# Patient Record
Sex: Male | Born: 1985 | Hispanic: No | Marital: Married | Smoking: Never smoker
Health system: Southern US, Community
[De-identification: ages and names within clinical notes are randomized; demographics above are authoritative.]

## PROBLEM LIST (undated history)

## (undated) DIAGNOSIS — J9383 Other pneumothorax: Secondary | ICD-10-CM

---

## 2016-05-17 ENCOUNTER — Emergency Department (HOSPITAL_COMMUNITY): Payer: BLUE CROSS/BLUE SHIELD

## 2016-05-17 ENCOUNTER — Inpatient Hospital Stay (HOSPITAL_COMMUNITY)
Admission: EM | Admit: 2016-05-17 | Discharge: 2016-05-21 | DRG: 200 | Disposition: A | Discharge status: Home / Self Care | Payer: BLUE CROSS/BLUE SHIELD | Attending: Cardiothoracic Surgery | Admitting: Cardiothoracic Surgery

## 2016-05-17 ENCOUNTER — Encounter (HOSPITAL_COMMUNITY): Payer: Self-pay | Admitting: *Deleted

## 2016-05-17 DIAGNOSIS — J9311 Primary spontaneous pneumothorax: Secondary | ICD-10-CM | POA: Diagnosis not present

## 2016-05-17 DIAGNOSIS — J90 Pleural effusion, not elsewhere classified: Secondary | ICD-10-CM | POA: Diagnosis present

## 2016-05-17 DIAGNOSIS — R609 Edema, unspecified: Secondary | ICD-10-CM | POA: Diagnosis present

## 2016-05-17 DIAGNOSIS — Z9689 Presence of other specified functional implants: Secondary | ICD-10-CM

## 2016-05-17 DIAGNOSIS — J9383 Other pneumothorax: Secondary | ICD-10-CM

## 2016-05-17 DIAGNOSIS — J939 Pneumothorax, unspecified: Secondary | ICD-10-CM

## 2016-05-17 HISTORY — DX: Other pneumothorax: J93.83

## 2016-05-17 HISTORY — PX: CHEST TUBE INSERTION: SHX231

## 2016-05-17 LAB — CBC
HCT: 47.7 % (ref 39.0–52.0)
HCT: 48.2 % (ref 39.0–52.0)
HEMOGLOBIN: 16.9 g/dL (ref 13.0–17.0)
Hemoglobin: 16.9 g/dL (ref 13.0–17.0)
MCH: 28.8 pg (ref 26.0–34.0)
MCH: 28.5 pg (ref 26.0–34.0)
MCHC: 35.4 g/dL (ref 30.0–36.0)
MCHC: 35.1 g/dL (ref 30.0–36.0)
MCV: 81.3 fL (ref 78.0–100.0)
MCV: 81.4 fL (ref 78.0–100.0)
PLATELETS: 202 10*3/uL (ref 150–400)
Platelets: 201 10*3/uL (ref 150–400)
RBC: 5.87 MIL/uL — AB (ref 4.22–5.81)
RBC: 5.92 MIL/uL — ABNORMAL HIGH (ref 4.22–5.81)
RDW: 12.6 % (ref 11.5–15.5)
RDW: 12.7 % (ref 11.5–15.5)
WBC: 6.9 10*3/uL (ref 4.0–10.5)
WBC: 9.4 10*3/uL (ref 4.0–10.5)

## 2016-05-17 LAB — BASIC METABOLIC PANEL
ANION GAP: 6 (ref 5–15)
BUN: 9 mg/dL (ref 6–20)
CO2: 25 mmol/L (ref 22–32)
Calcium: 8.8 mg/dL — ABNORMAL LOW (ref 8.9–10.3)
Chloride: 107 mmol/L (ref 101–111)
Creatinine, Ser: 0.94 mg/dL (ref 0.61–1.24)
Glucose, Bld: 101 mg/dL — ABNORMAL HIGH (ref 65–99)
POTASSIUM: 4.2 mmol/L (ref 3.5–5.1)
SODIUM: 138 mmol/L (ref 135–145)

## 2016-05-17 LAB — CREATININE, SERUM
Creatinine, Ser: 1.02 mg/dL (ref 0.61–1.24)
GFR calc Af Amer: >60 mL/min (ref >60)
GFR calc non Af Amer: >60 mL/min (ref >60)

## 2016-05-17 MED ORDER — SODIUM CHLORIDE 0.9% FLUSH
3.0000 mL | Freq: Two times a day (BID) | INTRAVENOUS | Status: DC
  Administered 2016-05-17 – 2016-05-21 (×7): 3 mL via INTRAVENOUS

## 2016-05-17 MED ORDER — ONDANSETRON HCL 4 MG PO TABS: 4.0000 mg | ORAL_TABLET | Freq: Four times a day (QID) | ORAL | Status: DC | PRN

## 2016-05-17 MED ORDER — KETOROLAC TROMETHAMINE 15 MG/ML IJ SOLN
15.0000 mg | Freq: Four times a day (QID) | INTRAMUSCULAR | Status: DC | PRN
  Administered 2016-05-19 – 2016-05-20 (×3): 15 mg via INTRAVENOUS
  Filled 2016-05-17 (×3): qty 1

## 2016-05-17 MED ORDER — ASPIRIN EC 81 MG PO TBEC
81.0000 mg | DELAYED_RELEASE_TABLET | Freq: Every day | ORAL | Status: DC
  Administered 2016-05-17 – 2016-05-21 (×5): 81 mg via ORAL
  Filled 2016-05-17 (×5): qty 1

## 2016-05-17 MED ORDER — LIDOCAINE HCL (PF) 1 % IJ SOLN
INTRAMUSCULAR | Status: AC
  Filled 2016-05-17: qty 30

## 2016-05-17 MED ORDER — ONDANSETRON HCL 4 MG/2ML IJ SOLN
4.0000 mg | Freq: Four times a day (QID) | INTRAMUSCULAR | Status: DC | PRN
  Administered 2016-05-18: 4 mg via INTRAVENOUS
  Filled 2016-05-17: qty 2

## 2016-05-17 MED ORDER — DM-GUAIFENESIN ER 30-600 MG PO TB12
1.0000 | ORAL_TABLET | Freq: Two times a day (BID) | ORAL | Status: DC
  Administered 2016-05-17 – 2016-05-21 (×8): 1 via ORAL
  Filled 2016-05-17 (×8): qty 1

## 2016-05-17 MED ORDER — ACETAMINOPHEN 650 MG RE SUPP: 650.0000 mg | Freq: Four times a day (QID) | RECTAL | Status: DC | PRN

## 2016-05-17 MED ORDER — ACETAMINOPHEN 325 MG PO TABS: 650.0000 mg | ORAL_TABLET | Freq: Four times a day (QID) | ORAL | Status: DC | PRN

## 2016-05-17 MED ORDER — OXYCODONE HCL 5 MG PO TABS
5.0000 mg | ORAL_TABLET | ORAL | Status: DC | PRN
  Administered 2016-05-17 – 2016-05-21 (×10): 5 mg via ORAL
  Filled 2016-05-17 (×10): qty 1

## 2016-05-17 MED ORDER — FENTANYL CITRATE (PF) 100 MCG/2ML IJ SOLN
50.0000 ug | INTRAMUSCULAR | Status: DC | PRN
  Administered 2016-05-17 – 2016-05-19 (×4): 50 ug via INTRAVENOUS
  Filled 2016-05-17 (×4): qty 2

## 2016-05-17 MED ORDER — SORBITOL 70 % SOLN
30.0000 mL | Freq: Every day | Status: DC | PRN
  Filled 2016-05-17: qty 30

## 2016-05-17 MED ORDER — MIDAZOLAM HCL 2 MG/2ML IJ SOLN
2.0000 mg | Freq: Once | INTRAMUSCULAR | Status: AC
  Administered 2016-05-17: 1 mg via INTRAVENOUS
  Filled 2016-05-17: qty 2

## 2016-05-17 MED ORDER — FENTANYL CITRATE (PF) 100 MCG/2ML IJ SOLN
50.0000 ug | INTRAMUSCULAR | Status: DC | PRN
  Administered 2016-05-17: 50 ug via INTRAVENOUS
  Filled 2016-05-17: qty 2

## 2016-05-17 MED ORDER — LIDOCAINE HCL (PF) 1 % IJ SOLN
30.0000 mL | Freq: Once | INTRAMUSCULAR | Status: AC
  Administered 2016-05-17: 30 mL

## 2016-05-17 MED ORDER — ENOXAPARIN SODIUM 40 MG/0.4ML ~~LOC~~ SOLN
40.0000 mg | SUBCUTANEOUS | Status: DC
  Administered 2016-05-18 – 2016-05-21 (×3): 40 mg via SUBCUTANEOUS
  Filled 2016-05-17 (×3): qty 0.4

## 2016-05-17 MED ORDER — LIDOCAINE HCL (CARDIAC) 20 MG/ML IV SOLN
INTRAVENOUS | Status: AC
  Filled 2016-05-17: qty 5

## 2016-05-17 MED ORDER — DEXTROSE-NACL 5-0.45 % IV SOLN
INTRAVENOUS | Status: DC
  Administered 2016-05-17 (×2): via INTRAVENOUS

## 2016-05-17 MED ORDER — SENNA 8.6 MG PO TABS
1.0000 | ORAL_TABLET | Freq: Two times a day (BID) | ORAL | Status: DC
  Administered 2016-05-17 – 2016-05-21 (×8): 8.6 mg via ORAL
  Filled 2016-05-17 (×8): qty 1

## 2016-05-17 NOTE — Progress Notes (Signed)
CT Surgery Op note  20 F L chest tube placed in L chest in ED for 40% L   spontaneous pneumothorax Patient to be admitted to 2 W for chest tube  management CXR pending  P Donata Clay MD

## 2016-05-17 NOTE — ED Notes (Signed)
Patient transported to X-ray 

## 2016-05-17 NOTE — ED Notes (Signed)
Attempted report x 2 

## 2016-05-17 NOTE — ED Triage Notes (Signed)
Pt saw yesterday for cough for 3 weeks and feels pressure with deep breath. Pt does have intermittent difficulty breathing.  No distress.  Pt was told he had a collapsed lung and sent here.

## 2016-05-17 NOTE — H&P (Signed)
[address]       [place] [address]             [phone]        Brylin Stanislawski [facility] Medical Record #[id number] Date of Birth: May 02, 1985  Referring: Dr. Lynelle Doctor  Primary Care: No PCP Per Patient  Chief Complaint:    Chief Complaint  Patient presents with  . Other    cough/collapsed lung  . Shortness of Breath  Patient examined, CT scan of chest reviewed showing significant 30-40 percent spontaneous left pneumothorax and chest tube placed in the emergency department  History of Present Illness:     Very nice 31 year old male nonsmoker presented to the ED with several days complaints of left chest discomfort shortness of breath and significant coughing preceding symptoms. He denies any trauma. He has no previous history of spontaneous pneumothorax. He denies any chronic lung disease asthma or previously abnormal chest x-ray. Chest x-ray in the emergency department showed a significant left spontaneous pneumothorax. He had normal saturation 96% on room air. Thoracic surgical evaluation was requested. I reviewed the x-ray and examined patient and recommended left chest tube placement.  Under local anesthesia and IV conscious sedation monitored with end-tidal CO2 he underwent placement (20 French chest tube with reexpansion of lung. There is no air leak from the chest tube following placement. The patient readmitted to the hospital for chest tube management.   Current Activity/ Functional Status: Patient works full time as a Arts development officer at [employer] The patient is married.   Zubrod Score: At the time of surgery this patient's most appropriate activity status/level should be described as:     0    Normal activity, no symptoms     1    Restricted in physical strenuous activity but ambulatory, able to do out light work     2    Ambulatory and capable of self care, unable to do work activities, up and about                  more than 50%  Of the time                                3    Only limited self care, in bed greater than 50% of waking hours     4    Completely disabled, no self care, confined to bed or chair     5    Moribund  History reviewed. No pertinent past medical history.  History reviewed. No pertinent surgical history.  History  Smoking Status  . Never Smoker  Smokeless Tobacco  . Never Used   History  Alcohol Use No    Social History   Social History  . Marital status: Married    Spouse name: N/A  . Number of children: N/A  . Years of education: N/A   Occupational History  . Not on file.   Social History Main Topics  . Smoking status: Never Smoker  . Smokeless tobacco: Never Used  . Alcohol use No  . Drug use: No  . Sexual activity: Not on file   Other Topics Concern  . Not on file   Social History Narrative  . No narrative on file    No Known Allergies  Current Facility-Administered Medications  Medication Dose Route Frequency Provider Last Rate Last Dose  .  acetaminophen (TYLENOL) tablet 650 mg  650 mg Oral Q6H PRN Kerin Perna, MD       Or  . acetaminophen (TYLENOL) suppository 650 mg  650 mg Rectal Q6H PRN Kerin Perna, MD      . aspirin EC tablet 81 mg  81 mg Oral Daily Kerin Perna, MD   81 mg at 05/17/16 1806  . dextrose 5 %-0.45 % sodium chloride infusion   Intravenous Continuous Kerin Perna, MD 30 mL/hr at 05/17/16 1806    . [START ON 05/18/2016] enoxaparin (LOVENOX) injection 40 mg  40 mg Subcutaneous Q24H Kerin Perna, MD      . fentaNYL (SUBLIMAZE) injection 50 mcg  50 mcg Intravenous Q4H PRN Kerin Perna, MD      . ketorolac (TORADOL) 15 MG/ML injection 15 mg  15 mg Intravenous Q6H PRN Kerin Perna, MD      . lidocaine (PF) (XYLOCAINE) 1 % injection           . ondansetron (ZOFRAN) tablet 4 mg  4 mg Oral Q6H PRN Kerin Perna, MD       Or  . ondansetron [facility]) injection 4 mg  4 mg Intravenous Q6H PRN Kerin Perna,  MD      . oxyCODONE (Oxy IR/ROXICODONE) immediate release tablet 5 mg  5 mg Oral Q4H PRN Kerin Perna, MD   5 mg at 05/17/16 1806  . senna (SENOKOT) tablet 8.6 mg  1 tablet Oral BID Kerin Perna, MD      . sodium chloride flush (NS) 0.9 % injection 3 mL  3 mL Intravenous Q12H Kerin Perna, MD      . sorbitol 70 % solution 30 mL  30 mL Oral Daily PRN Kerin Perna, MD       No current outpatient prescriptions on file.     (Not in a hospital admission)  History reviewed. No pertinent family history.   Review of Systems:       Cardiac Review of Systems: Y or N  Chest Pain [ y   ]  Resting SOB [ n  ] Exertional SOB  Cove.Etienne  ]  Orthopnea Cove.Etienne  ]   Pedal Edema [ n  ]    Palpitations Milo.Brash  ] Syncope  [ n ]   Presyncope [  n ]  General Review of Systems: [Y] = yes [  ]=no Constitional: recent weight change [  ]; anorexia [  ]; fatigue [  ]; nausea [  ]; night sweats [  ]; fever [  ]; or chills [  ]                                                               Dental: poor dentition[  ]; Last Dentist visit:   Eye : blurred vision [  ]; diplopia [   ]; vision changes [  ];  Amaurosis fugax[  ]; Resp: cough [  ];  wheezing[  ];  hemoptysis[  ]; shortness of breath[  ]; paroxysmal nocturnal dyspnea[  ]; dyspnea on exertion[y  ]; or orthopnea[  ];  GI:  gallstones[  ], vomiting[  ];  dysphagia[  ]; melena[  ];  hematochezia [  ]; heartburn[  ];  Hx of  Colonoscopy[  ]; GU: kidney stones [  ]; hematuria[  ];   dysuria [  ];  nocturia[  ];  history of     obstruction [  ]; urinary frequency [  ]             Skin: rash, swelling[  ];, hair loss[  ];  peripheral edema[  ];  or itching[  ]; Musculosketetal: myalgias[  ];  joint swelling[  ];  joint erythema[  ];  joint pain[  ];  back pain[  ];  Heme/Lymph: bruising[  ];  bleeding[  ];  anemia[  ];  Neuro: TIA[  ];  headaches[  ];  stroke[  ];  vertigo[  ];  seizures[  ];   paresthesias[  ];  difficulty walking[  ];  Psych:depression[  ]; anxiety[   ];  Endocrine: diabetes[  ];  thyroid dysfunction[  ];  Immunizations: Flu [  ]; Pneumococcal[  ];  Other: Right-hand dominant No previous hospitalizations or operations  Physical Exam: BP 106/67   Pulse (!) 55   Temp 99.3 F (37.4 C) (Oral)   Resp 18   SpO2 99%        Physical Exam  General: Well-nourished well-developed [place] male no acute distress but anxious HEENT: Normocephalic pupils equal , dentition adequate Neck: Supple without JVD, adenopathy, or bruit Chest: Diminished breath sounds on left side sounds, no rhonchi, no tenderness             or deformity Cardiovascular: Regular rate and rhythm, no murmur, no gallop, peripheral pulses             palpable in all extremities Abdomen:  Soft, nontender, no palpable mass or organomegaly Extremities: Warm, well-perfused, no clubbing cyanosis edema or tenderness,              no venous stasis changes of the legs Rectal/GU: Deferred Neuro: Grossly non--focal and symmetrical throughout Skin: Clean and dry without rash or ulceration   Diagnostic Studies & Laboratory data:     Recent Radiology Findings:   Dg Chest 2 View  Result Date: 05/17/2016 CLINICAL DATA:  Follow-up collapsed left lung demonstrated on outside chest x-ray. EXAM: CHEST  2 VIEW COMPARISON:  None in PACs FINDINGS: There is an approximately 30% left hydropneumothorax. There is no mediastinal shift. The right lung is clear. The heart and pulmonary vascularity are normal. The bony thorax exhibits no acute abnormality. IMPRESSION: There is an approximately 30% left-sided hydropneumothorax. Electronically Signed   By: David  [place] M.D.   On: 05/17/2016 15:01   Dg Chest Port 1 View  Result Date: 05/17/2016 CLINICAL DATA:  Followup spontaneous left pneumothorax. Status post chest tube placement. EXAM: PORTABLE CHEST 1 VIEW COMPARISON:  05/17/2016 FINDINGS: There has been interval placement of a left-sided chest tube. Small approximately 10% left apical  pneumothorax has significantly decreased in size. Increased atelectasis seen in left perihilar region. Right lung remains clear. Heart size is normal. IMPRESSION: Decreased size of small left apical pneumothorax following chest tube placement. Increased left midlung atelectasis. Electronically Signed   By: Myles Rosenthal M.D.   On: 05/17/2016 17:56     I have independently reviewed the above radiologic studies.  Recent Lab Findings: Lab Results  Component Value Date   WBC 9.4 05/17/2016   HGB 16.9 05/17/2016   HCT 48.2 05/17/2016   PLT 201 05/17/2016   GLUCOSE 101 (H) 05/17/2016   NA 138 05/17/2016   K 4.2  05/17/2016   CL 107 05/17/2016   CREATININE 0.94 05/17/2016   BUN 9 05/17/2016   CO2 25 05/17/2016      Assessment / Plan:     Spontaneous left pneumothorax-first episode, 30-40 percent  20 French left chest tube placed in emergency department under local anesthesia and IV conscious moderate sedation  Patient will be admitted to the hospital for chest tube management, pain control, and serial chest x-ray assessment of pneumothorax.       @ 05/17/2016 7:10 PM

## 2016-05-17 NOTE — ED Provider Notes (Signed)
[facility]-EMERGENCY DEPT Provider Note   CSN: [id number] Arrival date & time: 05/17/16  1327     History   Chief Complaint Chief Complaint  Patient presents with  . Other    cough/collapsed lung  . Shortness of Breath    HPI Carlos Ward is a 31 y.o. male.  HPI Patient presents to the emergency room with complaints of possible collapsed lung. Patient states he had been having trouble with a cough for the last few weeks. He started having some discomfort in his chest with deep breathing. He went to [facility] the other day. Initially he was told his chest x-ray was normal. Patient states are his call today and was told that he had a collapsed lung any need to come to the emergency room. Patient denies any shortness of breath at this point. He denies any fevers or chills. He does continue to have some of that discomfort he tries to take a deep breath. History reviewed. No pertinent past medical history.  There are no active problems to display for this patient.   History reviewed. No pertinent surgical history.     Home Medications    Prior to Admission medications   Not on File    Family History History reviewed. No pertinent family history.  Social History Social History  Substance Use Topics  . Smoking status: Never Smoker  . Smokeless tobacco: Never Used  . Alcohol use No     Allergies   Patient has no known allergies.   Review of Systems Review of Systems  All other systems reviewed and are negative.    Physical Exam Updated Vital Signs BP 123/73   Pulse 63   Temp 99.3 F (37.4 C) (Oral)   Resp 19   SpO2 94%   Physical Exam  Constitutional: He appears well-developed and well-nourished. No distress.  HENT:  Head: Normocephalic and atraumatic.  Right Ear: External ear normal.  Left Ear: External ear normal.  Eyes: Conjunctivae are normal. Right eye exhibits no discharge. Left eye exhibits no discharge. No scleral icterus.  Neck: Neck supple. No  tracheal deviation present.  Cardiovascular: Normal rate, regular rhythm and intact distal pulses.   Pulmonary/Chest: Effort normal. No stridor. He has no wheezes. He has no rales.  Diminished breath sounds on the left side  Abdominal: Soft. Bowel sounds are normal. He exhibits no distension. There is no tenderness. There is no rebound and no guarding.  Musculoskeletal: He exhibits no edema or tenderness.  Neurological: He is alert. He has normal strength. No cranial nerve deficit (no facial droop, extraocular movements intact, no slurred speech) or sensory deficit. He exhibits normal muscle tone. He displays no seizure activity. Coordination normal.  Skin: Skin is warm and dry. No rash noted.  Psychiatric: He has a normal mood and affect.  Nursing note and vitals reviewed.    ED Treatments / Results  Labs (all labs ordered are listed, but only abnormal results are displayed) Labs Reviewed  CBC - Abnormal; Notable for the following:       Result Value   RBC 5.87 (*)    All other components within normal limits  BASIC METABOLIC PANEL - Abnormal; Notable for the following:    Glucose, Bld 101 (*)    Calcium 8.8 (*)    All other components within normal limits   Radiology Dg Chest 2 View  Result Date: 05/17/2016 CLINICAL DATA:  Follow-up collapsed left lung demonstrated on outside chest x-ray. EXAM: CHEST  2 VIEW  COMPARISON:  None in PACs FINDINGS: There is an approximately 30% left hydropneumothorax. There is no mediastinal shift. The right lung is clear. The heart and pulmonary vascularity are normal. The bony thorax exhibits no acute abnormality. IMPRESSION: There is an approximately 30% left-sided hydropneumothorax. Electronically Signed   By: David  [place] M.D.   On: 05/17/2016 15:01    Procedures Procedures (including critical care time)  Medications Ordered in ED Medications - No data to display   Initial Impression / Assessment and Plan / ED Course  I have reviewed the  triage vital signs and the nursing notes.  Pertinent labs & imaging results that were available during my care of the patient were reviewed by me and considered in my medical decision making (see chart for details).  Clinical Course as of May 18 1538  Thu May 17, 2016  1517 Discussed with CT surgery.  Dr Morton Peters will see the patient in the ED  [JK]    Clinical Course User Index [JK] Linwood Dibbles, MD    Patient presents to the emergency room with a spontaneous pneumothorax. No evidence of tension pneumothorax and he remains stable.  Chest tube per Dr Morton Peters  Final Clinical Impressions(s) / ED Diagnoses   Final diagnoses:  Primary spontaneous pneumothorax      Linwood Dibbles, MD 05/17/16 1540

## 2016-05-17 NOTE — ED Notes (Signed)
Gave  additional versed per physician

## 2016-05-17 NOTE — ED Notes (Signed)
Attempted report x1. 

## 2016-05-18 ENCOUNTER — Inpatient Hospital Stay (HOSPITAL_COMMUNITY): Payer: BLUE CROSS/BLUE SHIELD

## 2016-05-18 LAB — CBC
HCT: 45.3 % (ref 39.0–52.0)
Hemoglobin: 15.8 g/dL (ref 13.0–17.0)
MCH: 28.4 pg (ref 26.0–34.0)
MCHC: 34.9 g/dL (ref 30.0–36.0)
MCV: 81.5 fL (ref 78.0–100.0)
Platelets: 203 10*3/uL (ref 150–400)
RBC: 5.56 MIL/uL (ref 4.22–5.81)
RDW: 12.7 % (ref 11.5–15.5)
WBC: 10.7 10*3/uL — ABNORMAL HIGH (ref 4.0–10.5)

## 2016-05-18 LAB — BASIC METABOLIC PANEL
Anion gap: 9 (ref 5–15)
BUN: 9 mg/dL (ref 6–20)
CO2: 25 mmol/L (ref 22–32)
Calcium: 8.4 mg/dL — ABNORMAL LOW (ref 8.9–10.3)
Chloride: 100 mmol/L — ABNORMAL LOW (ref 101–111)
Creatinine, Ser: 1 mg/dL (ref 0.61–1.24)
GFR calc Af Amer: >60 mL/min (ref >60)
GFR calc non Af Amer: >60 mL/min (ref >60)
Glucose, Bld: 162 mg/dL — ABNORMAL HIGH (ref 65–99)
Potassium: 4 mmol/L (ref 3.5–5.1)
Sodium: 134 mmol/L — ABNORMAL LOW (ref 135–145)

## 2016-05-18 LAB — HIV ANTIBODY (ROUTINE TESTING W REFLEX): HIV Screen 4th Generation wRfx: NONREACTIVE

## 2016-05-18 NOTE — Discharge Instructions (Signed)

## 2016-05-18 NOTE — Progress Notes (Addendum)
      [address]       [place] [address]             [phone]           Subjective: Patient with some pain at chest tube site.  Objective: Vital signs in last 24 hours: Temp:  [97.8 F (36.6 C)-99.3 F (37.4 C)] 98.1 F (36.7 C) (04/06 0548) Pulse Rate:  [55-76] 56 (04/06 0548) Cardiac Rhythm: Sinus bradycardia;Other (Comment) (04/05 1956) Resp:  [15-95] 18 (04/06 0548) BP: (106-128)/(66-76) 115/68 (04/06 0548) SpO2:  [94 %-100 %] 98 % (04/06 0548) Weight:  [59.7 kg (131 lb 11.2 oz)] 59.7 kg (131 lb 11.2 oz) (04/05 2002)     Intake/Output from previous day: 04/05 0701 - 04/06 0700 In: 150 [P.O.:150] Out: 350 [Urine:350]   Physical Exam:  Cardiovascular: RRR Pulmonary: Clear to auscultation bilaterally Abdomen: Soft, non tender, bowel sounds present. Extremities: Mild bilateral lower extremity edema. Wounds: Dressing is clean and dry.   Chest Tube: to suction, no air leak  Lab Results: CBC: Recent Labs  05/17/16 1823 05/18/16 0235  WBC 9.4 10.7*  HGB 16.9 15.8  HCT 48.2 45.3  PLT 201 203   BMET:  Recent Labs  05/17/16 1409 05/17/16 1823 05/18/16 0235  NA 138  --  134*  K 4.2  --  4.0  CL 107  --  100*  CO2 25  --  25  GLUCOSE 101*  --  162*  BUN 9  --  9  CREATININE 0.94 1.02 1.00  CALCIUM 8.8*  --  8.4*    PT/INR: No results for input(s): LABPROT, INR in the last 72 hours. ABG:  INR: Will add last result for INR, ABG once components are confirmed Will add last 4 CBG results once components are confirmed  Assessment/Plan:  1. CV - SR in the 60's 2.  Pulmonary - S/p 20 French left chest tube placement yesterday for spontaneous pneumothorax. Chest tube is to suction and there is no air leak. CXR this am appears to show trace left apical pneumothorax. Will likely place to water seal in am as chest tube just placed last evening. Check CXR in am.  ZIMMERMAN,DONIELLE MPA-C 05/18/2016,7:30 AM Plan water seal on chest  tube tomorrow patient examined and medical record reviewed,agree with above note. Kathlee Nations Trigt III 05/18/2016

## 2016-05-18 NOTE — Discharge Summary (Signed)
Physician Discharge Summary       [address]       [place] [address]             [phone]    Patient ID: Carlos Ward MRN: [id number] DOB/AGE: 1986/02/08 31 y.o.  Admit date: 05/17/2016 Discharge date: 05/21/2016  Admission Diagnoses: Spontaneous left pneumothorax  History of Presenting Illness: This is a [place] 31 year old male nonsmoker presented to the ED with several days complaints of left chest discomfort shortness of breath and significant coughing preceding symptoms. He denies any trauma. He has no previous history of spontaneous pneumothorax. He denies any chronic lung disease asthma or previously abnormal chest x-ray. Chest x-ray in the emergency department showed a significant left spontaneous pneumothorax. He had normal saturation 96% on room air. Thoracic surgical evaluation was requested. Dr. Donata Clay reviewed the x-ray and examined patient and recommended left chest tube placement.  Under local anesthesia and IV conscious sedation monitored with end-tidal CO2 he underwent placement (20 French chest tube with reexpansion of lung. There is no air leak from the chest tube following placement. The patient was admitted to the hospital for chest tube management.  Brief Hospital Course:  Daily chest x rays were obtained and remained stable. There was no air leak from the chest tube. Chest tube was placed to water seal on 05/19/2016. Chest tube was removed on 05/20/2016. He is ambulating on room air. He is tolerating a diet. CXR this am shows No recurrent pneumothorax following left-sided chest tube placement. Persistent tiny posterior left-sided pleural effusion. He will return to the office next week for removal of his chest tube stitches.  He is felt surgically stable for discharge today.   Latest Vital Signs: Blood pressure 123/74, pulse 81, temperature 98.6 F (37 C), temperature source Oral, resp. rate 18, height  (1.6 m), weight 59.7 kg (131 lb 11.2  oz), SpO2 98 %.  Physical Exam: Cardiovascular: RRR Pulmonary: Clear to auscultation bilaterally Abdomen: Soft, non tender, bowel sounds present. Extremities: Mild bilateral lower extremity edema. Wounds: Dressing is clean and dry.   Discharge Condition:Stable and discharged to home.  Recent laboratory studies:  Lab Results  Component Value Date   WBC 10.7 (H) 05/18/2016   HGB 15.8 05/18/2016   HCT 45.3 05/18/2016   MCV 81.5 05/18/2016   PLT 203 05/18/2016   Lab Results  Component Value Date   NA 134 (L) 05/18/2016   K 4.0 05/18/2016   CL 100 (L) 05/18/2016   CO2 25 05/18/2016   CREATININE 1.00 05/18/2016   GLUCOSE 162 (H) 05/18/2016   Diagnostic Studies:   CLINICAL DATA:  Chest tube  EXAM: CHEST  2 VIEW  COMPARISON:  Yesterday  FINDINGS: Chest tube on the left with tip at the apex. No visible pneumothorax. The lungs are clear. Heart size is normal. Trace left pleural effusion.  IMPRESSION: Left chest tube with no visible pneumothorax. Mild atelectasis and trace left effusion.   Electronically Signed   By: Marnee Spring M.D.   On: 05/20/2016 07:06  Discharge Medications: Allergies as of 05/21/2016   No Known Allergies     Medication List    TAKE these medications   aspirin 81 MG EC tablet Take 1 tablet (81 mg total) by mouth daily.   traMADol 50 MG tablet Commonly known as:  ULTRAM Take 1 tablet (50 mg total) by mouth every 6 (six) hours as needed for moderate pain.       Follow Up  Appointments: Follow-up Information    Kathlee Nations Trigt III, MD Follow up on 06/13/2016.   Specialty:  Cardiothoracic Surgery Why:  Your appointment is on May 2nd at 12:30pm. Pa/LAT CXR to be taken (at [facility] which is in the same building as Dr. Zenaida Niece Trigt's office) 30 minutes prior to office appointment. PLease arrive at 12:00pm.  Contact information: [address] [place] [address] [phone]        nursing appointment.  Call in 1 day(s).   Why:  PLease arrive at 10:30 on Monday 4/16 for your suture removal appointment with a nurse Contact information: Dr. Zenaida Niece Trigt's office          Signed: Bernadette Hoit ContePA-C 05/21/2016, 10:22 AM

## 2016-05-19 ENCOUNTER — Inpatient Hospital Stay (HOSPITAL_COMMUNITY): Payer: BLUE CROSS/BLUE SHIELD

## 2016-05-19 NOTE — Progress Notes (Addendum)
      [address]       [place] [address]             [phone]           Subjective: Patient with some pain left posterior back this am.  Objective: Vital signs in last 24 hours: Temp:  [98.1 F (36.7 C)-98.3 F (36.8 C)] 98.3 F (36.8 C) (04/07 0420) Pulse Rate:  [59-84] 84 (04/07 0420) Cardiac Rhythm: Other (Comment) (04/06 1900) Resp:  [18-20] 18 (04/07 0420) BP: (114-123)/(57-71) 123/68 (04/07 0420) SpO2:  [94 %-98 %] 94 % (04/07 0420)     Intake/Output from previous day: 04/06 0701 - 04/07 0700 In: 773 [P.O.:240; I.V.:533] Out: 1249 [Urine:1225; Chest Tube:24]   Physical Exam:  Cardiovascular: RRR Pulmonary: Clear to auscultation bilaterally Wounds: Dressing is clean and dry.   Chest Tube: to suction, no air leak  Lab Results: CBC:  Recent Labs  05/17/16 1823 05/18/16 0235  WBC 9.4 10.7*  HGB 16.9 15.8  HCT 48.2 45.3  PLT 201 203   BMET:   Recent Labs  05/17/16 1409 05/17/16 1823 05/18/16 0235  NA 138  --  134*  K 4.2  --  4.0  CL 107  --  100*  CO2 25  --  25  GLUCOSE 101*  --  162*  BUN 9  --  9  CREATININE 0.94 1.02 1.00  CALCIUM 8.8*  --  8.4*    PT/INR: No results for input(s): LABPROT, INR in the last 72 hours. ABG:  INR: Will add last result for INR, ABG once components are confirmed Will add last 4 CBG results once components are confirmed  Assessment/Plan:  1. CV - SR in the 70's 2.  Pulmonary - S/p 20 French left chest tube placement yesterday for spontaneous pneumothorax. Chest tube is to suction and there is no air leak. CXR appears stable-no pneumothorax. Will place chest tube to water seal.    ZIMMERMAN,DONIELLE MPA-C 05/19/2016,7:23 AM  Chest tube to water seal today poss remove in am I have seen and examined Laveda Norman and agree with the above assessment  and plan.  Delight Ovens MD Beeper [phone] Office [phone] 05/19/2016 10:10 AM

## 2016-05-20 ENCOUNTER — Inpatient Hospital Stay (HOSPITAL_COMMUNITY): Payer: BLUE CROSS/BLUE SHIELD

## 2016-05-20 NOTE — Progress Notes (Signed)
Chest tube removed per protocol, pt tolerated well.  Will continue to monitor. 

## 2016-05-20 NOTE — Progress Notes (Addendum)
      [address]       [place] [address]             [phone]           Subjective: Patient without complaints this am.  Objective: Vital signs in last 24 hours: Temp:  [97.8 F (36.6 C)-98.5 F (36.9 C)] 97.8 F (36.6 C) (04/08 0509) Pulse Rate:  [57-74] 58 (04/08 0509) Cardiac Rhythm: Normal sinus rhythm (04/07 2206) Resp:  [16-18] 16 (04/08 0509) BP: (115-133)/(64-70) 115/68 (04/08 0509) SpO2:  [95 %-98 %] 98 % (04/08 0509)     Intake/Output from previous day: 04/07 0701 - 04/08 0700 In: -  Out: 750 [Urine:750]   Physical Exam:  Cardiovascular: RRR Pulmonary: Clear to auscultation bilaterally Wounds: Dressing is clean and dry.     Lab Results: CBC:  Recent Labs  05/17/16 1823 05/18/16 0235  WBC 9.4 10.7*  HGB 16.9 15.8  HCT 48.2 45.3  PLT 201 203   BMET:   Recent Labs  05/17/16 1409 05/17/16 1823 05/18/16 0235  NA 138  --  134*  K 4.2  --  4.0  CL 107  --  100*  CO2 25  --  25  GLUCOSE 101*  --  162*  BUN 9  --  9  CREATININE 0.94 1.02 1.00  CALCIUM 8.8*  --  8.4*    PT/INR: No results for input(s): LABPROT, INR in the last 72 hours. ABG:  INR: Will add last result for INR, ABG once components are confirmed Will add last 4 CBG results once components are confirmed  Assessment/Plan:  1. CV - SR in the 70's 2.  Pulmonary - S/p 20 French left chest tube placement for spontaneous pneumothorax. Chest tube is to water seal and there is no air leak. CXR appears stable-no pneumothorax. Will remove chest tube 3. Home in am if CXR stable   ZIMMERMAN,DONIELLE MPA-C 05/20/2016,7:14 AM  Chest tube out this am, follow up chest xray in am I have seen and examined Laveda Norman and agree with the above assessment  and plan.  Delight Ovens MD [facility] [phone] Office [phone] 05/20/2016 11:35 AM

## 2016-05-21 ENCOUNTER — Inpatient Hospital Stay (HOSPITAL_COMMUNITY): Payer: BLUE CROSS/BLUE SHIELD

## 2016-05-21 MED ORDER — TRAMADOL HCL 50 MG PO TABS: 50.0000 mg | ORAL_TABLET | Freq: Four times a day (QID) | ORAL | 0 refills | Status: AC | PRN

## 2016-05-21 MED ORDER — ASPIRIN 81 MG PO TBEC: 81.0000 mg | DELAYED_RELEASE_TABLET | Freq: Every day | ORAL | 1 refills | Status: AC

## 2016-05-21 NOTE — Progress Notes (Signed)
Pt ambulated in the hall and tolerated well.

## 2016-05-21 NOTE — Care Management Note (Signed)
Case Management Note Donn Pierini RN, BSN Unit 2W-Case Manager [phone]  Patient Details  Name: Carlos Ward MRN: [id number] Date of Birth: [birth year]  Subjective/Objective:  Pt admitted with  Pntx- chest tube placement                 Action/Plan: PTA pt lived at home with spouse- independent- plan to return home- no CM needs noted for discharge  Expected Discharge Date:  05/21/16               Expected Discharge Plan:  Home/Self Care  In-House Referral:     Discharge planning Services  CM Consult  Post Acute Care Choice:  NA Choice offered to:  NA  DME Arranged:    DME Agency:     HH Arranged:    HH Agency:     Status of Service:  Completed, signed off  If discussed at [employer] of Stay Meetings, dates discussed:    Discharge Disposition: home/self care   Additional Comments:  05/21/16- 1000- Donn Pierini RN, CM- chest tube has been removed- pt for d/c home today- no needs noted.   Darrold Span, RN 05/21/2016, 9:59 AM

## 2016-05-21 NOTE — Progress Notes (Signed)
Discussed with the patient and all questioned fully answered. He will call me if any problems arise.  IV removed. Telemetry removed. Pt sent home with paper Rx.   Leonidas Romberg, RN

## 2016-05-21 NOTE — Progress Notes (Signed)
      [address]       [place] [address]             [phone]        Subjective: No issues overnight  Objective: Vital signs in last 24 hours: Temp:  [98.5 F (36.9 C)-98.6 F (37 C)] 98.6 F (37 C) (04/09 0454) Pulse Rate:  [60-81] 81 (04/09 0454) Cardiac Rhythm: Normal sinus rhythm (04/08 2200) Resp:  [18] 18 (04/09 0454) BP: (106-123)/(66-74) 123/74 (04/09 0454) SpO2:  [98 %] 98 % (04/09 0454)    Intake/Output from previous day: 04/08 0701 - 04/09 0700 In: -  Out: 225 [Urine:225] Intake/Output this shift: No intake/output data recorded.  General appearance: alert, cooperative and no distress Heart: regular rate and rhythm, S1, S2 normal, no murmur, click, rub or gallop Lungs: clear to auscultation bilaterally Abdomen: soft, non-tender; bowel sounds normal; no masses,  no organomegaly Extremities: extremities normal, atraumatic, no cyanosis or edema Wound: clean and dry  Lab Results: No results for input(s): WBC, HGB, HCT, PLT in the last 72 hours. BMET: No results for input(s): NA, K, CL, CO2, GLUCOSE, BUN, CREATININE, CALCIUM in the last 72 hours.  PT/INR: No results for input(s): LABPROT, INR in the last 72 hours. ABG No results found for: PHART, HCO3, TCO2, ACIDBASEDEF, O2SAT CBG (last 3)  No results for input(s): GLUCAP in the last 72 hours.  Assessment/Plan:  1. CV- SR in the  80s with stable blood pressure 2. Pulm- chest tube removal yesterday. CXR from this morning showed: No recurrent pneumothorax following left-sided chest tube placement. Persistent tiny posterior left-sided pleural effusion.   Plan: Likely home today. Suture removal appointment next week.    LOS: 4 days    Sharlene Dory 05/21/2016

## 2016-05-28 ENCOUNTER — Encounter (INDEPENDENT_AMBULATORY_CARE_PROVIDER_SITE_OTHER): Payer: BLUE CROSS/BLUE SHIELD

## 2016-05-28 DIAGNOSIS — Z4802 Encounter for removal of sutures: Secondary | ICD-10-CM

## 2016-06-06 ENCOUNTER — Other Ambulatory Visit: Payer: Self-pay | Admitting: *Deleted

## 2016-06-06 DIAGNOSIS — J939 Pneumothorax, unspecified: Secondary | ICD-10-CM

## 2016-06-07 ENCOUNTER — Ambulatory Visit
Admission: RE | Admit: 2016-06-07 | Discharge: 2016-06-07 | Disposition: A | Discharge status: Home / Self Care | Payer: BLUE CROSS/BLUE SHIELD | Source: Ambulatory Visit | Attending: Cardiothoracic Surgery | Admitting: Cardiothoracic Surgery

## 2016-06-07 ENCOUNTER — Encounter: Payer: Self-pay | Admitting: Cardiothoracic Surgery

## 2016-06-07 ENCOUNTER — Ambulatory Visit (INDEPENDENT_AMBULATORY_CARE_PROVIDER_SITE_OTHER): Payer: BLUE CROSS/BLUE SHIELD | Admitting: Cardiothoracic Surgery

## 2016-06-07 VITALS — BP 140/82 | HR 72 | Resp 20 | Ht 63.0 in | Wt 135.0 lb

## 2016-06-07 DIAGNOSIS — J939 Pneumothorax, unspecified: Secondary | ICD-10-CM

## 2016-06-07 NOTE — Progress Notes (Signed)
PCP is No PCP Per Patient Referring Provider is No ref. provider found  Chief Complaint  Patient presents with  . Spontaneous Pneumothorax    f/u from hospital with CXR    HPI: The patient returns for scheduled postop follow-up The patient had a spontaneous left pneumothorax treated with a chest tube 3 weeks ago. The air leak and pneumothorax resolved after approximately 72 hours. He's had no recurrent symptoms of shortness of breath or pleuritic pain. He still having some discomfort from chest tube site which is healed Chest x-ray performed today shows complete reexpansion of the left lung, no pneumothorax, no abnormalities of the lung  The patient plans on flying to the [facility] for a academic conference and he was given medical clearance to do so. The patient understands he should try not to lift more than 20-25 pounds for 3 months-until July 1-in order to avoid recurrent spontaneous pneumothorax which could occur with incidence of 10-15 percent  Past Medical History:  Diagnosis Date  . Spontaneous pneumothorax 05/17/2016   left    Past Surgical History:  Procedure Laterality Date  . CHEST TUBE INSERTION Left 05/17/2016    No family history on file.  Social History Social History  Substance Use Topics  . Smoking status: Never Smoker  . Smokeless tobacco: Never Used  . Alcohol use No    Current Outpatient Prescriptions  Medication Sig Dispense Refill  . aspirin EC 81 MG EC tablet Take 1 tablet (81 mg total) by mouth daily. 30 tablet 1  . fluticasone (FLONASE) 50 MCG/ACT nasal spray Place 1 spray into both nostrils daily.   1   No current facility-administered medications for this visit.     No Known Allergies  Review of Systems  Patient somewhat tired and fatigued at work Having some positional vertigo No pulmonary symptoms No hemoptysis or cough  BP 140/82   Pulse 72   Resp 20   Ht  (1.6 m)   Wt 135 lb (61.2 kg)   SpO2 98% Comment: RA  BMI  23.91 kg/m  Physical Exam      Exam    General- alert and comfortable   Lungs- clear without rales, wheezes   Cor- regular rate and rhythm, no murmur , gallop   Abdomen- soft, non-tender   Extremities - warm, non-tender, minimal edema   Neuro- oriented, appropriate, no focal weakness   Diagnostic Tests: Chest x-ray clear  Impression: Resolved spontaneous pneumothorax status post left chest tube  Plan: Return as needed Activity limitations as stated above OK to fly commercial airline  To conference next month  Mikey Bussing, MD [facility] [phone]

## 2016-06-13 ENCOUNTER — Ambulatory Visit: Payer: BLUE CROSS/BLUE SHIELD | Admitting: Cardiothoracic Surgery

## 2017-12-02 IMAGING — CR DG CHEST 2V
2 series · 2 of 2 positions shown · non-contrast
Comparison: Chest x-ray of 05/21/2016

CLINICAL DATA: History of left pneumothorax, followup

EXAM:
CHEST  2 VIEW

[w chest pa]
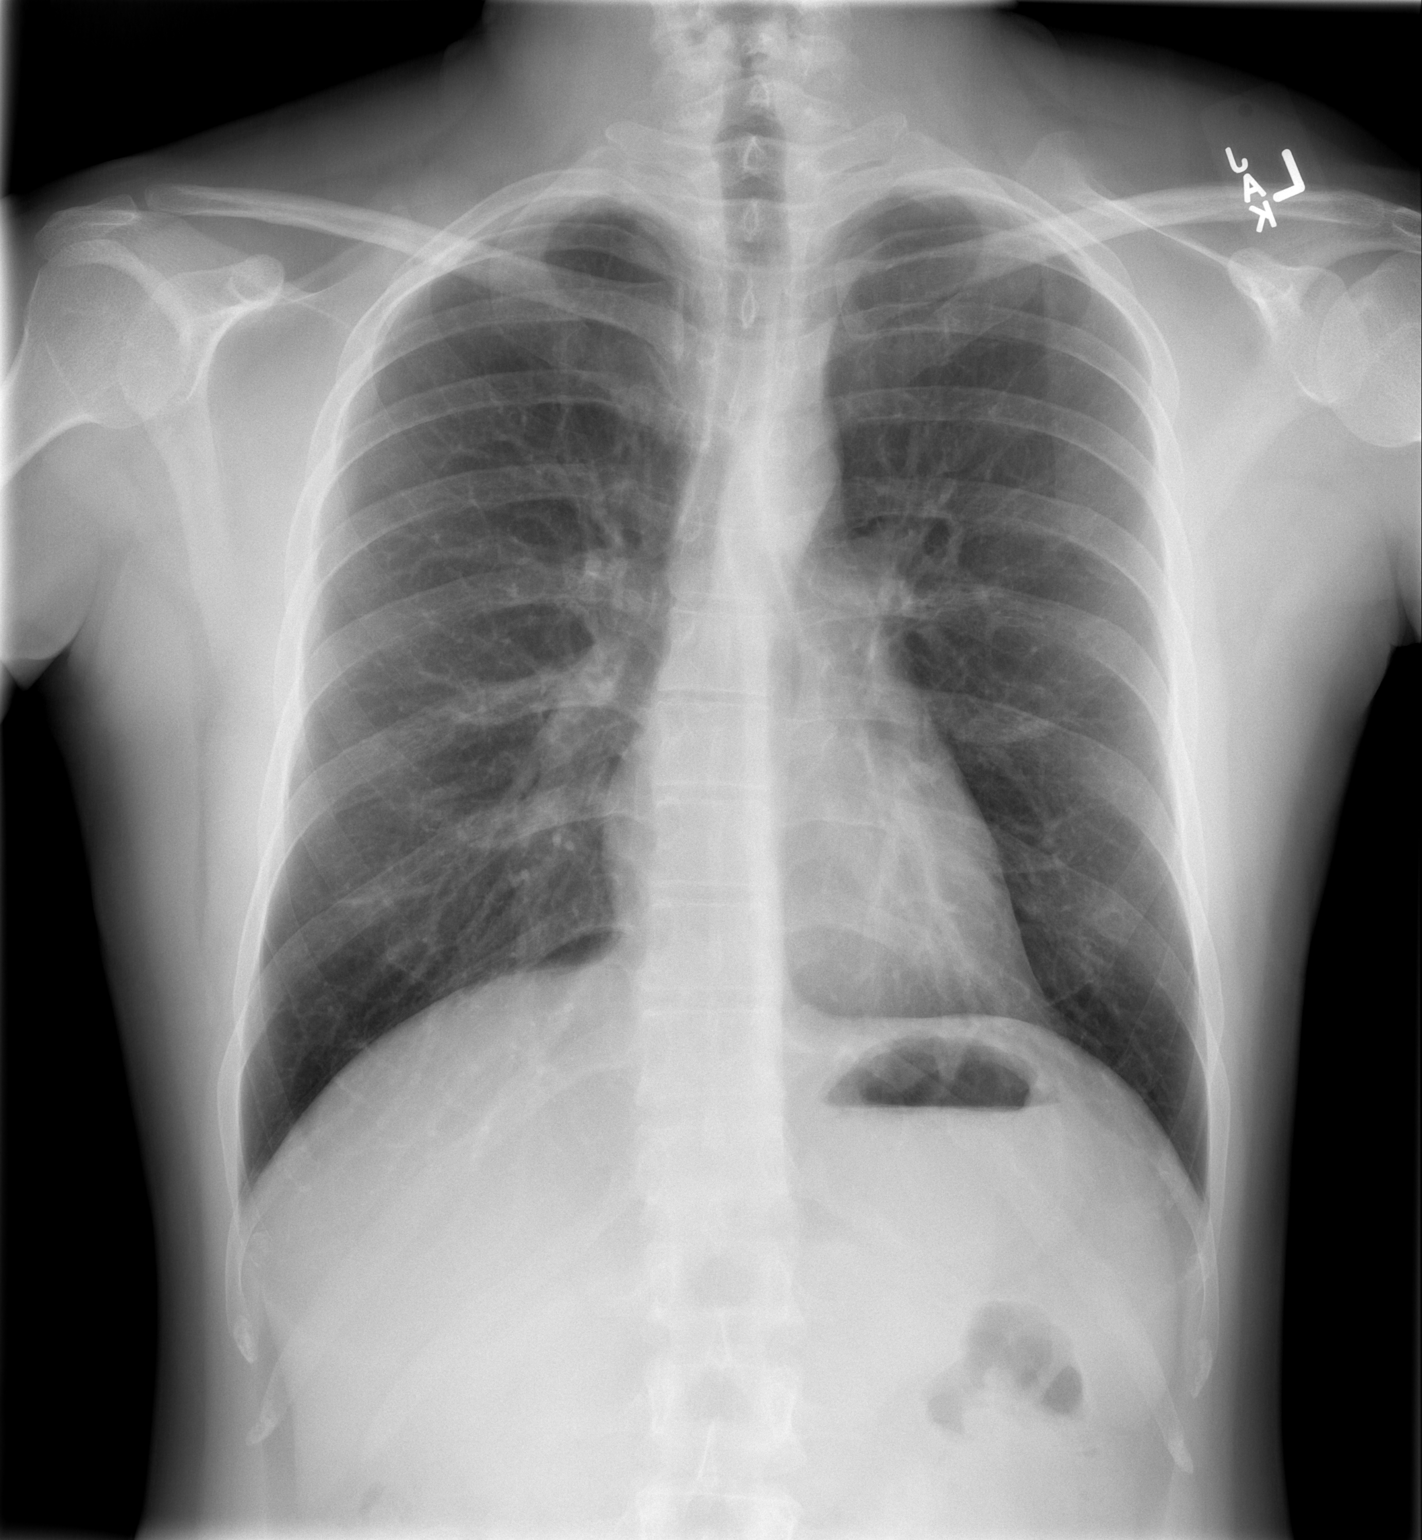

[w chest lat]
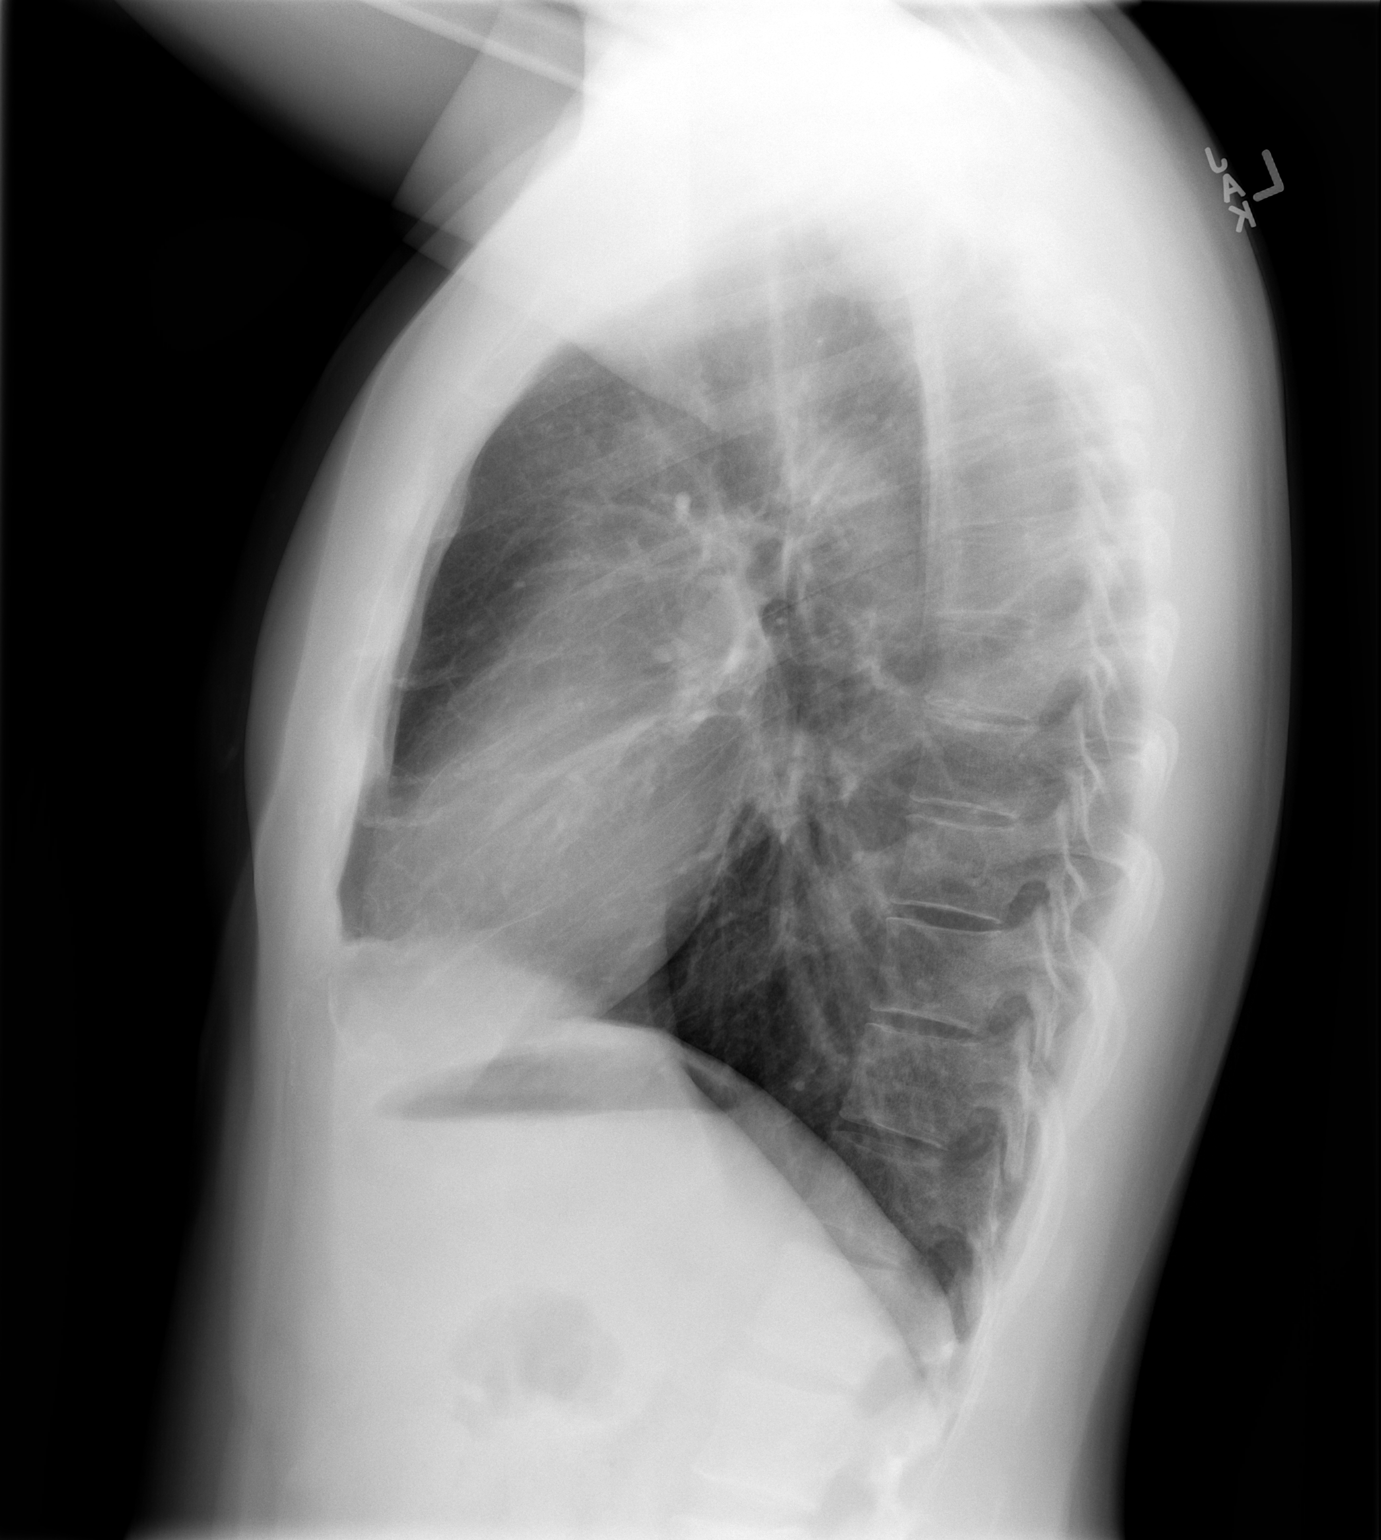

[2 of 2 positions shown; findings below may reference images not displayed]

FINDINGS: The previous effusion has resolved. No pneumothorax is seen. The
lungs are clear. Mediastinal and hilar contours are unremarkable.
The heart is within normal limits in size.
IMPRESSION: No active cardiopulmonary disease.

## 2020-05-06 ENCOUNTER — Ambulatory Visit: Payer: BLUE CROSS/BLUE SHIELD | Admitting: Orthopaedic Surgery

## 2020-05-13 ENCOUNTER — Ambulatory Visit (INDEPENDENT_AMBULATORY_CARE_PROVIDER_SITE_OTHER): Payer: BC Managed Care – PPO | Admitting: Orthopaedic Surgery

## 2020-05-13 ENCOUNTER — Other Ambulatory Visit: Payer: Self-pay

## 2020-05-13 ENCOUNTER — Encounter: Payer: Self-pay | Admitting: Orthopaedic Surgery

## 2020-05-13 DIAGNOSIS — M79642 Pain in left hand: Secondary | ICD-10-CM | POA: Diagnosis not present

## 2020-05-13 DIAGNOSIS — M65322 Trigger finger, left index finger: Secondary | ICD-10-CM | POA: Diagnosis not present

## 2020-05-13 DIAGNOSIS — M79641 Pain in right hand: Secondary | ICD-10-CM

## 2020-05-13 MED ORDER — LIDOCAINE HCL 1 % IJ SOLN
0.3000 mL | INTRAMUSCULAR | Status: AC | PRN
  Administered 2020-05-13: .3 mL

## 2020-05-13 MED ORDER — METHYLPREDNISOLONE ACETATE 40 MG/ML IJ SUSP
13.3300 mg | INTRAMUSCULAR | Status: AC | PRN
  Administered 2020-05-13: 13.33 mg

## 2020-05-13 MED ORDER — BUPIVACAINE HCL 0.5 % IJ SOLN
0.3300 mL | INTRAMUSCULAR | Status: AC | PRN
  Administered 2020-05-13: .33 mL

## 2020-05-13 NOTE — Progress Notes (Addendum)
Office Visit Note   Patient: Carlos Ward           Date of Birth: 23-Nov-1985           MRN: [id number] Visit Date: 05/13/2020              Requested by: No referring provider defined for this encounter. PCP: Patient, No Pcp Per (Inactive)   Assessment & Plan: Visit Diagnoses:  1. Trigger index finger of left hand     Plan: Impression is left index stenosing tenosynovitis questionable bilateral carpal tunnel syndrome.  Based on treatment options to include medications and injections he opted for the injection.  We will also place him in AlumaFoam splint to wear at nighttime for the next couple weeks.  Patient will follow-up as needed.  Follow-Up Instructions: No follow-ups on file.   Orders:  No orders of the defined types were placed in this encounter.  No orders of the defined types were placed in this encounter.     Procedures: Hand/UE Inj: L index A1 for trigger finger on 05/13/2020 8:32 AM Indications: pain Details: 25 G needle Medications: 0.3 mL lidocaine 1 %; 0.33 mL bupivacaine 0.5 %; 13.33 mg methylPREDNISolone acetate 40 MG/ML Outcome: tolerated well, no immediate complications Consent was given by the patient. Patient was prepped and draped in the usual sterile fashion.       Clinical Data: No additional findings.   Subjective: Chief Complaint  Patient presents with  . Right Hand - Pain  . 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  Denies any injuries.  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  He endorses numbness in his hands and forearms worse on the left.   Review of Systems  Constitutional: Negative.   All other systems reviewed and are negative.    Objective: Vital Signs: There were no vitals taken for this visit.  Physical Exam Vitals and nursing note  reviewed.  Constitutional:      Appearance: He is well-developed.  HENT:     Head: Normocephalic and atraumatic.  Eyes:     Pupils: Pupils are equal, round, and reactive to light.  Pulmonary:     Effort: Pulmonary effort is normal.  Abdominal:     Palpations: Abdomen is soft.  Musculoskeletal:        General: Normal range of motion.     Cervical back: Neck supple.  Skin:    General: Skin is warm.  Neurological:     Mental Status: He is alert and oriented to person, place, and time.  Psychiatric:        Behavior: Behavior normal.        Thought Content: Thought content normal.        Judgment: Judgment normal.     Ortho Exam Left hand shows slight tenderness of the flexor tendon possible nodule at the flexor palmar crease in line with the index finger.  No significant triggering.  Negative carpal tunnel compressive signs. Specialty Comments:  No specialty comments available.  Imaging: No results found.   PMFS History: Patient Active Problem List   Diagnosis Date Noted  . Pneumothorax on left 05/17/2016   Past Medical History:  Diagnosis Date  . Spontaneous pneumothorax 05/17/2016   left    History reviewed.  No pertinent family history.  Past Surgical History:  Procedure Laterality Date  . CHEST TUBE INSERTION Left 05/17/2016   Social History   Occupational History  . Not on file  Tobacco Use  . Smoking status: Never Smoker  . Smokeless tobacco: Never Used  Substance and Sexual Activity  . Alcohol use: No  . Drug use: No  . Sexual activity: Yes

## 2020-05-13 NOTE — Addendum Note (Signed)
Addended by: Albertina Parr on: 05/13/2020 08:39 AM   Modules accepted: Orders

## 2020-07-01 ENCOUNTER — Encounter: Payer: BC Managed Care – PPO | Admitting: Physical Medicine and Rehabilitation

## 2020-07-01 ENCOUNTER — Telehealth: Payer: Self-pay | Admitting: Physical Medicine and Rehabilitation

## 2020-07-01 NOTE — Telephone Encounter (Signed)
Pt called stating he was supposed to be seen today but he isn't feeling well and he thinks he may have the flu. Pt would like a CB to r/s please.   [phone]

## 2020-07-01 NOTE — Telephone Encounter (Signed)
Pt returned phone call.  

## 2020-07-01 NOTE — Telephone Encounter (Signed)
Left message #1 to reschedule appointment. 

## 2020-07-05 NOTE — Telephone Encounter (Signed)
Rescheduled

## 2020-08-05 ENCOUNTER — Encounter: Payer: Self-pay | Admitting: Physical Medicine and Rehabilitation

## 2020-08-05 ENCOUNTER — Other Ambulatory Visit: Payer: Self-pay

## 2020-08-05 ENCOUNTER — Ambulatory Visit (INDEPENDENT_AMBULATORY_CARE_PROVIDER_SITE_OTHER): Payer: BC Managed Care – PPO | Admitting: Physical Medicine and Rehabilitation

## 2020-08-05 DIAGNOSIS — R202 Paresthesia of skin: Secondary | ICD-10-CM | POA: Diagnosis not present

## 2020-08-05 NOTE — Progress Notes (Signed)
Patient was having numbness in left index finger as well as trigger finger. Injection by Dr. Roda Shutters helped. Now has a tired feeling in both arms and hands when he is holding thing. Right hand dominant Numeric Pain Rating Scale and Functional Assessment Average Pain 4   In the last MONTH (on 0-10 scale) has pain interfered with the following?  1. General activity like being  able to carry out your everyday physical activities such as walking, climbing stairs, carrying groceries, or moving a chair?  Rating(4)

## 2020-08-07 NOTE — Progress Notes (Signed)
Carlos Ward - 35 y.o. male MRN [id number]  Date of birth: 1985-04-06  Office Visit Note: Visit Date: 08/05/2020 PCP: Patient, No Pcp Per (Inactive) Referred by: Tarry Kos, MD  Subjective: Chief Complaint  Patient presents with   Right Hand - Numbness, Other   Left Hand - Numbness, Other   HPI:  Carlos Ward is a 35 y.o. male who comes in today at the request of Dr. Glee Arvin for electrodiagnostic study of the Bilateral upper extremities.  Patient is Right hand dominant.  He reports history of trigger finger in the left index finger but this has pretty much resolved after injection by Dr. Roda Shutters.  He does have a feeling of tiredness and fullness in both hands particularly when holding objects for quite a while.  Some numbness still in the radial digits left more than right.  Initial symptoms seem to start when had a newborn child and doing a lot of holding and using his hands for that.  ROS Otherwise per HPI.  Assessment & Plan: Visit Diagnoses:    ICD-10-CM   1. Paresthesia of skin  R20.2 NCV with EMG (electromyography)      Plan: Impression: Essentially NORMAL electrodiagnostic study of both upper limbs.  There is no significant electrodiagnostic evidence of nerve entrapment, brachial plexopathy or cervical radiculopathy.  As you know, purely sensory or demyelinating radiculopathies and chemical radiculitis may not be detected with this particular electrodiagnostic study.  Recommendations: 1.  Follow-up with referring physician. 2.  Continue current management of symptoms.  Meds & Orders: No orders of the defined types were placed in this encounter.   Orders Placed This Encounter  Procedures   NCV with EMG (electromyography)    Follow-up: Return in about 2 weeks (around 08/19/2020) for  Glee Arvin, MD.   Procedures: No procedures performed  EMG & NCV Findings: All nerve conduction studies (as indicated in the following tables) were within normal limits.  All left vs. right side  differences were within normal limits.    All examined muscles (as indicated in the following table) showed no evidence of electrical instability.    Impression: Essentially NORMAL electrodiagnostic study of both upper limbs.  There is no significant electrodiagnostic evidence of nerve entrapment, brachial plexopathy or cervical radiculopathy.  As you know, purely sensory or demyelinating radiculopathies and chemical radiculitis may not be detected with this particular electrodiagnostic study.  Recommendations: 1.  Follow-up with referring physician. 2.  Continue current management of symptoms.  ___________________________ Naaman Plummer FAAPMR Board Certified, American Board of Physical Medicine and Rehabilitation    Nerve Conduction Studies Anti Sensory Summary Table   Stim Site NR Peak (ms) Norm Peak (ms) P-T Amp (V) Norm P-T Amp Site1 Site2 Delta-P (ms) Dist (cm) Vel (m/s) Norm Vel (m/s)  Left Median Acr Palm Anti Sensory (2nd Digit)  31.2C  Wrist    3.0 <3.6 48.9 >10 Wrist Palm 1.3 0.0    Palm    1.7 <2.0 50.9         Right Median Acr Palm Anti Sensory (2nd Digit)  31C  Wrist    2.9 <3.6 58.5 >10 Wrist Palm 1.3 0.0    Palm    1.6 <2.0 86.0         Left Radial Anti Sensory (Base 1st Digit)  30.7C  Wrist    2.1 <3.1 54.9  Wrist Base 1st Digit 2.1 0.0    Left Ulnar Anti Sensory (5th Digit)  31.4C  Wrist  3.2 <3.7 61.1 >15.0 Wrist 5th Digit 3.2 14.0 44 >38   Motor Summary Table   Stim Site NR Onset (ms) Norm Onset (ms) O-P Amp (mV) Norm O-P Amp Site1 Site2 Delta-0 (ms) Dist (cm) Vel (m/s) Norm Vel (m/s)  Left Median Motor (Abd Poll Brev)  30.9C  Wrist    2.8 <4.2 8.0 >5 Elbow Wrist 3.4 18.5 54 >50  Elbow    6.2  7.8         Right Median Motor (Abd Poll Brev)  31.4C  Wrist    2.9 <4.2 10.0 >5 Elbow Wrist 3.0 18.5 62 >50  Elbow    5.9  9.9         Left Ulnar Motor (Abd Dig Min)  31C  Wrist    2.5 <4.2 9.9 >3 B Elbow Wrist 3.2 17.0 53 >53  B Elbow    5.7  9.7  A Elbow  B Elbow 1.3 10.0 77 >53  A Elbow    7.0  9.6          EMG   Side Muscle Nerve Root Ins Act Fibs Psw Amp Dur Poly Recrt Int Dennie Bible Comment  Left Abd Poll Brev Median C8-T1 Nml Nml Nml Nml Nml 0 Nml Nml   Left 1stDorInt Ulnar C8-T1 Nml Nml Nml Nml Nml 0 Nml Nml   Left PronatorTeres Median C6-7 Nml Nml Nml Nml Nml 0 Nml Nml   Left Biceps Musculocut C5-6 Nml Nml Nml Nml Nml 0 Nml Nml   Left Deltoid Axillary C5-6 Nml Nml Nml Nml Nml 0 Nml Nml     Nerve Conduction Studies Anti Sensory Left/Right Comparison   Stim Site L Lat (ms) R Lat (ms) L-R Lat (ms) L Amp (V) R Amp (V) L-R Amp (%) Site1 Site2 L Vel (m/s) R Vel (m/s) L-R Vel (m/s)  Median Acr Palm Anti Sensory (2nd Digit)  31.2C  Wrist 3.0 2.9 0.1 48.9 58.5 16.4 Wrist Palm     Palm 1.7 1.6 0.1 50.9 86.0 40.8       Radial Anti Sensory (Base 1st Digit)  30.7C  Wrist 2.1   54.9   Wrist Base 1st Digit     Ulnar Anti Sensory (5th Digit)  31.4C  Wrist 3.2   61.1   Wrist 5th Digit 44     Motor Left/Right Comparison   Stim Site L Lat (ms) R Lat (ms) L-R Lat (ms) L Amp (mV) R Amp (mV) L-R Amp (%) Site1 Site2 L Vel (m/s) R Vel (m/s) L-R Vel (m/s)  Median Motor (Abd Poll Brev)  30.9C  Wrist 2.8 2.9 0.1 8.0 10.0 20.0 Elbow Wrist 54 62 8  Elbow 6.2 5.9 0.3 7.8 9.9 21.2       Ulnar Motor (Abd Dig Min)  31C  Wrist 2.5   9.9   B Elbow Wrist 53    B Elbow 5.7   9.7   A Elbow B Elbow 77    A Elbow 7.0   9.6            Waveforms:               Clinical History: No specialty comments available.     Objective:  VS:  HT:    WT:   BMI:     BP:   HR: bpm  TEMP: ( )  RESP:  Physical Exam Musculoskeletal:        General: No tenderness.     Comments: Inspection reveals no  atrophy of the bilateral APB or FDI or hand intrinsics. There is no swelling, color changes, allodynia or dystrophic changes. There is 5 out of 5 strength in the bilateral wrist extension, finger abduction and long finger flexion. There is intact sensation to light  touch in all dermatomal and peripheral nerve distributions.  There is a negative Hoffmann's test bilaterally.  Skin:    General: Skin is warm and dry.     Findings: No erythema or rash.  Neurological:     General: No focal deficit present.     Mental Status: He is alert and oriented to person, place, and time.     Sensory: No sensory deficit.     Motor: No weakness or abnormal muscle tone.     Coordination: Coordination normal.     Gait: Gait normal.  Psychiatric:        Mood and Affect: Mood normal.        Behavior: Behavior normal.        Thought Content: Thought content normal.     Imaging: No results found.

## 2020-08-08 NOTE — Procedures (Signed)
EMG & NCV Findings: All nerve conduction studies (as indicated in the following tables) were within normal limits.  All left vs. right side differences were within normal limits.    All examined muscles (as indicated in the following table) showed no evidence of electrical instability.    Impression: Essentially NORMAL electrodiagnostic study of both upper limbs.  There is no significant electrodiagnostic evidence of nerve entrapment, brachial plexopathy or cervical radiculopathy.  As you know, purely sensory or demyelinating radiculopathies and chemical radiculitis may not be detected with this particular electrodiagnostic study.  Recommendations: 1.  Follow-up with referring physician. 2.  Continue current management of symptoms.  ___________________________ Naaman Plummer FAAPMR Board Certified, American Board of Physical Medicine and Rehabilitation    Nerve Conduction Studies Anti Sensory Summary Table   Stim Site NR Peak (ms) Norm Peak (ms) P-T Amp (V) Norm P-T Amp Site1 Site2 Delta-P (ms) Dist (cm) Vel (m/s) Norm Vel (m/s)  Left Median Acr Palm Anti Sensory (2nd Digit)  31.2C  Wrist    3.0 <3.6 48.9 >10 Wrist Palm 1.3 0.0    Palm    1.7 <2.0 50.9         Right Median Acr Palm Anti Sensory (2nd Digit)  31C  Wrist    2.9 <3.6 58.5 >10 Wrist Palm 1.3 0.0    Palm    1.6 <2.0 86.0         Left Radial Anti Sensory (Base 1st Digit)  30.7C  Wrist    2.1 <3.1 54.9  Wrist Base 1st Digit 2.1 0.0    Left Ulnar Anti Sensory (5th Digit)  31.4C  Wrist    3.2 <3.7 61.1 >15.0 Wrist 5th Digit 3.2 14.0 44 >38   Motor Summary Table   Stim Site NR Onset (ms) Norm Onset (ms) O-P Amp (mV) Norm O-P Amp Site1 Site2 Delta-0 (ms) Dist (cm) Vel (m/s) Norm Vel (m/s)  Left Median Motor (Abd Poll Brev)  30.9C  Wrist    2.8 <4.2 8.0 >5 Elbow Wrist 3.4 18.5 54 >50  Elbow    6.2  7.8         Right Median Motor (Abd Poll Brev)  31.4C  Wrist    2.9 <4.2 10.0 >5 Elbow Wrist 3.0 18.5 62 >50  Elbow     5.9  9.9         Left Ulnar Motor (Abd Dig Min)  31C  Wrist    2.5 <4.2 9.9 >3 B Elbow Wrist 3.2 17.0 53 >53  B Elbow    5.7  9.7  A Elbow B Elbow 1.3 10.0 77 >53  A Elbow    7.0  9.6          EMG   Side Muscle Nerve Root Ins Act Fibs Psw Amp Dur Poly Recrt Int Dennie Bible Comment  Left Abd Poll Brev Median C8-T1 Nml Nml Nml Nml Nml 0 Nml Nml   Left 1stDorInt Ulnar C8-T1 Nml Nml Nml Nml Nml 0 Nml Nml   Left PronatorTeres Median C6-7 Nml Nml Nml Nml Nml 0 Nml Nml   Left Biceps Musculocut C5-6 Nml Nml Nml Nml Nml 0 Nml Nml   Left Deltoid Axillary C5-6 Nml Nml Nml Nml Nml 0 Nml Nml     Nerve Conduction Studies Anti Sensory Left/Right Comparison   Stim Site L Lat (ms) R Lat (ms) L-R Lat (ms) L Amp (V) R Amp (V) L-R Amp (%) Site1 Site2 L Vel (m/s) R Vel (m/s) L-R Vel (m/s)  Median Acr Palm Anti Sensory (2nd Digit)  31.2C  Wrist 3.0 2.9 0.1 48.9 58.5 16.4 Wrist Palm     Palm 1.7 1.6 0.1 50.9 86.0 40.8       Radial Anti Sensory (Base 1st Digit)  30.7C  Wrist 2.1   54.9   Wrist Base 1st Digit     Ulnar Anti Sensory (5th Digit)  31.4C  Wrist 3.2   61.1   Wrist 5th Digit 44     Motor Left/Right Comparison   Stim Site L Lat (ms) R Lat (ms) L-R Lat (ms) L Amp (mV) R Amp (mV) L-R Amp (%) Site1 Site2 L Vel (m/s) R Vel (m/s) L-R Vel (m/s)  Median Motor (Abd Poll Brev)  30.9C  Wrist 2.8 2.9 0.1 8.0 10.0 20.0 Elbow Wrist 54 62 8  Elbow 6.2 5.9 0.3 7.8 9.9 21.2       Ulnar Motor (Abd Dig Min)  31C  Wrist 2.5   9.9   B Elbow Wrist 53    B Elbow 5.7   9.7   A Elbow B Elbow 77    A Elbow 7.0   9.6            Waveforms:
